# Patient Record
Sex: Male | Born: 1975 | Race: White | Hispanic: No | Marital: Married | Smoking: Never smoker
Health system: Southern US, Community
[De-identification: ages and names within clinical notes are randomized; demographics above are authoritative.]

## PROBLEM LIST (undated history)

## (undated) HISTORY — PX: GANGLION CYST EXCISION: SHX1691

---

## 2012-08-04 ENCOUNTER — Emergency Department (HOSPITAL_COMMUNITY)

## 2012-08-04 ENCOUNTER — Encounter (HOSPITAL_COMMUNITY): Payer: Self-pay | Admitting: *Deleted

## 2012-08-04 ENCOUNTER — Emergency Department (HOSPITAL_COMMUNITY)
Admission: EM | Admit: 2012-08-04 | Discharge: 2012-08-04 | Disposition: A | Discharge status: Home / Self Care | Attending: Emergency Medicine | Admitting: Emergency Medicine

## 2012-08-04 DIAGNOSIS — B9789 Other viral agents as the cause of diseases classified elsewhere: Secondary | ICD-10-CM | POA: Insufficient documentation

## 2012-08-04 DIAGNOSIS — R0789 Other chest pain: Secondary | ICD-10-CM | POA: Insufficient documentation

## 2012-08-04 DIAGNOSIS — R059 Cough, unspecified: Secondary | ICD-10-CM | POA: Insufficient documentation

## 2012-08-04 DIAGNOSIS — R05 Cough: Secondary | ICD-10-CM | POA: Insufficient documentation

## 2012-08-04 DIAGNOSIS — B349 Viral infection, unspecified: Secondary | ICD-10-CM

## 2012-08-04 MED ORDER — AZITHROMYCIN 250 MG PO TABS: 250.0000 mg | ORAL_TABLET | Freq: Every day | ORAL | Status: AC

## 2012-08-04 MED ORDER — IBUPROFEN 600 MG PO TABS: 600.0000 mg | ORAL_TABLET | Freq: Four times a day (QID) | ORAL | Status: AC | PRN

## 2012-08-04 NOTE — ED Notes (Signed)
States wife was dx with pneumonia yesterday.

## 2012-08-04 NOTE — ED Notes (Signed)
Pt c/o chest congestion, productive cough.  Cough worsens when lying down.  Feels SOB.  Denies n/v, fevers/chills.

## 2012-08-05 NOTE — ED Provider Notes (Signed)
History     CSN: [id number]  Arrival date & time 08/04/12  7829   First MD Initiated Contact with Patient 08/04/12 [phone]      Chief Complaint  Patient presents with  . Nasal Congestion  . Shortness of Breath    (Consider location/radiation/quality/duration/timing/severity/associated sxs/prior treatment) HPI Comments: Pt with no medical hx comes in with cc of cough and some chest tightness. Pt reports that his wife was dx with CAP, and this with productive cough with yellow green phlegm, and some chest tightness, he wanted to make sure he had no infection. Pt has no fevers, no n/v/chills/hemoptysis. Pt has bene having his sx for about 5 days now. No wheezing.  Patient is a 36 y.o. male presenting with shortness of breath. The history is provided by the patient.  Shortness of Breath  Associated symptoms include cough and shortness of breath. Pertinent negatives include no chest pain.    History reviewed. No pertinent past medical history.  Past Surgical History  Procedure Date  . Ganglion cyst excision     right wrist    History reviewed. No pertinent family history.  History  Substance Use Topics  . Smoking status: Never Smoker   . Smokeless tobacco: Not on file  . Alcohol Use: Yes     moeration      Review of Systems  Constitutional: Negative for activity change and appetite change.  Respiratory: Positive for cough, chest tightness and shortness of breath.   Cardiovascular: Negative for chest pain.  Gastrointestinal: Negative for abdominal pain.  Genitourinary: Negative for dysuria.    Allergies  Review of patient's allergies indicates no known allergies.  Home Medications   Current Outpatient Rx  Name Route Sig Dispense Refill  . NYQUIL PO Oral Take 2 capsules by mouth 2 (two) times daily as needed. For congestion    . AZITHROMYCIN 250 MG PO TABS Oral Take 1 tablet (250 mg total) by mouth daily. Take first 2 tablets together, then 1 every day until  finished. 6 tablet 0  . IBUPROFEN 600 MG PO TABS Oral Take 1 tablet (600 mg total) by mouth every 6 (six) hours as needed for pain. 30 tablet 0    BP 112/70  Pulse 56  Temp 97.2 F (36.2 C) (Oral)  Resp 18  SpO2 98%  Physical Exam  Nursing note and vitals reviewed. Constitutional: He is oriented to person, place, and time. He appears well-developed.  HENT:  Head: Normocephalic and atraumatic.  Eyes: Conjunctivae and EOM are normal. Pupils are equal, round, and reactive to light.  Neck: Normal range of motion. Neck supple.  Cardiovascular: Normal rate and regular rhythm.   Pulmonary/Chest: Effort normal and breath sounds normal. No respiratory distress. He has no wheezes. He has no rales. He exhibits no tenderness.  Abdominal: Soft. Bowel sounds are normal. He exhibits no distension. There is no tenderness. There is no rebound and no guarding.  Neurological: He is alert and oriented to person, place, and time.  Skin: Skin is warm.    ED Course  Procedures (including critical care time)  Labs Reviewed - No data to display Dg Chest 2 View  08/04/2012  *RADIOLOGY REPORT*  Clinical Data: Cough, shortness of breath, and chest pain.  CHEST - 2 VIEW  Comparison: None.  Findings: Pulmonary hyperinflation. The heart size and pulmonary vascularity are normal. The lungs appear clear and expanded without focal air space disease or consolidation. No blunting of the costophrenic angles. No pneumothorax.  Mediastinal contours  appear intact.  IMPRESSION: No evidence of active pulmonary disease.   Original Report Authenticated By: Marlon Pel, M.D.      1. Viral syndrome       MDM  DDX includes: Viral syndrome Influenza Pharyngitis CAP Bronchitis  Pt with 5 day of productive cough and chest tightness, with no wheezing, no fevers. Lung exam is completely normal. We will get CXR - if no infiltrate, will give AB since patient has a sick contact with CAP and CXr is not very sensitive  - but will advocate wait and watch approach.        Derwood Kaplan, MD 08/05/12 [phone]

## 2013-06-12 IMAGING — CR DG CHEST 2V
2 series · 2 of 2 positions shown · non-contrast
Comparison: None.

CLINICAL DATA: Cough, shortness of breath, and chest pain.

CHEST - 2 VIEW

[w chest pa]
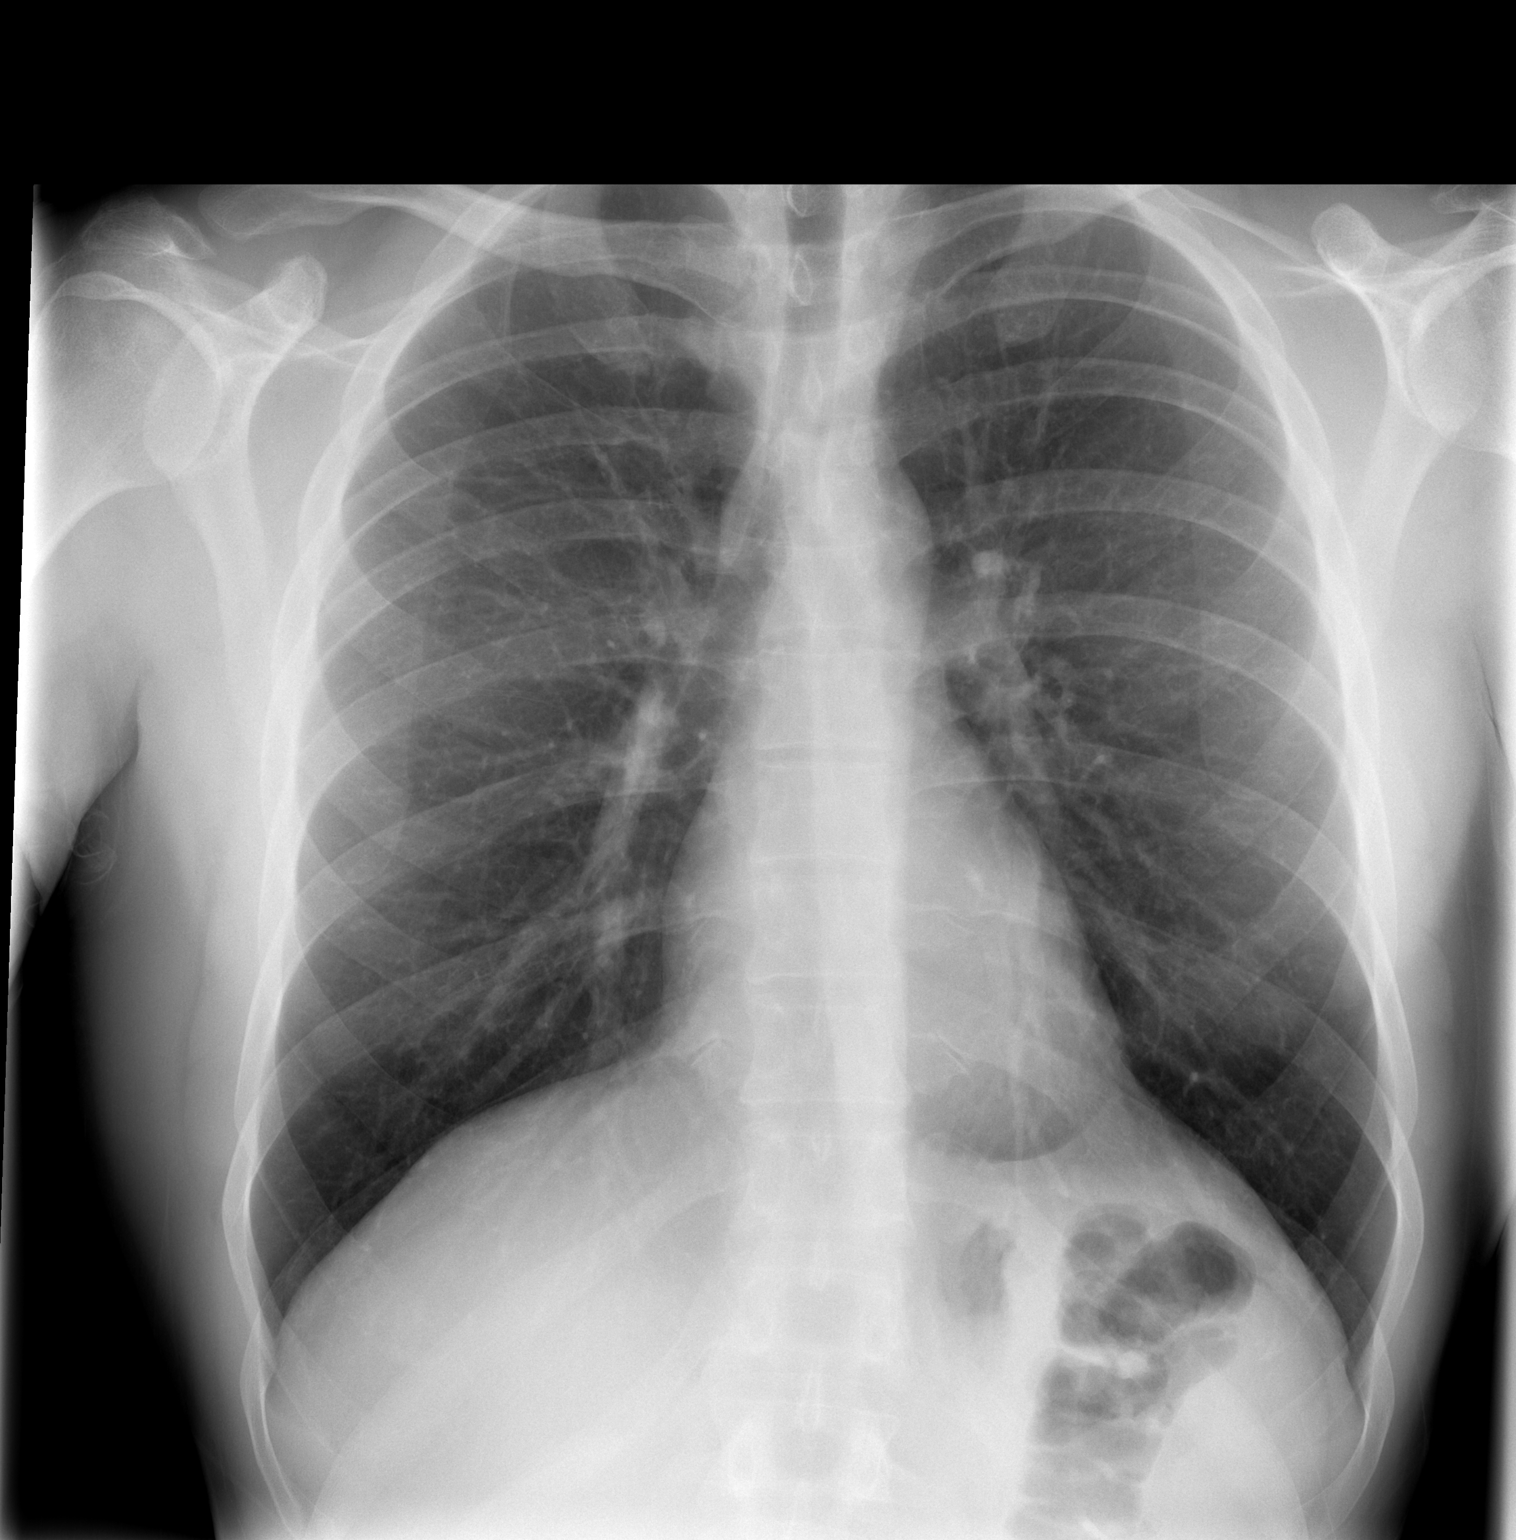

[w chest lat]
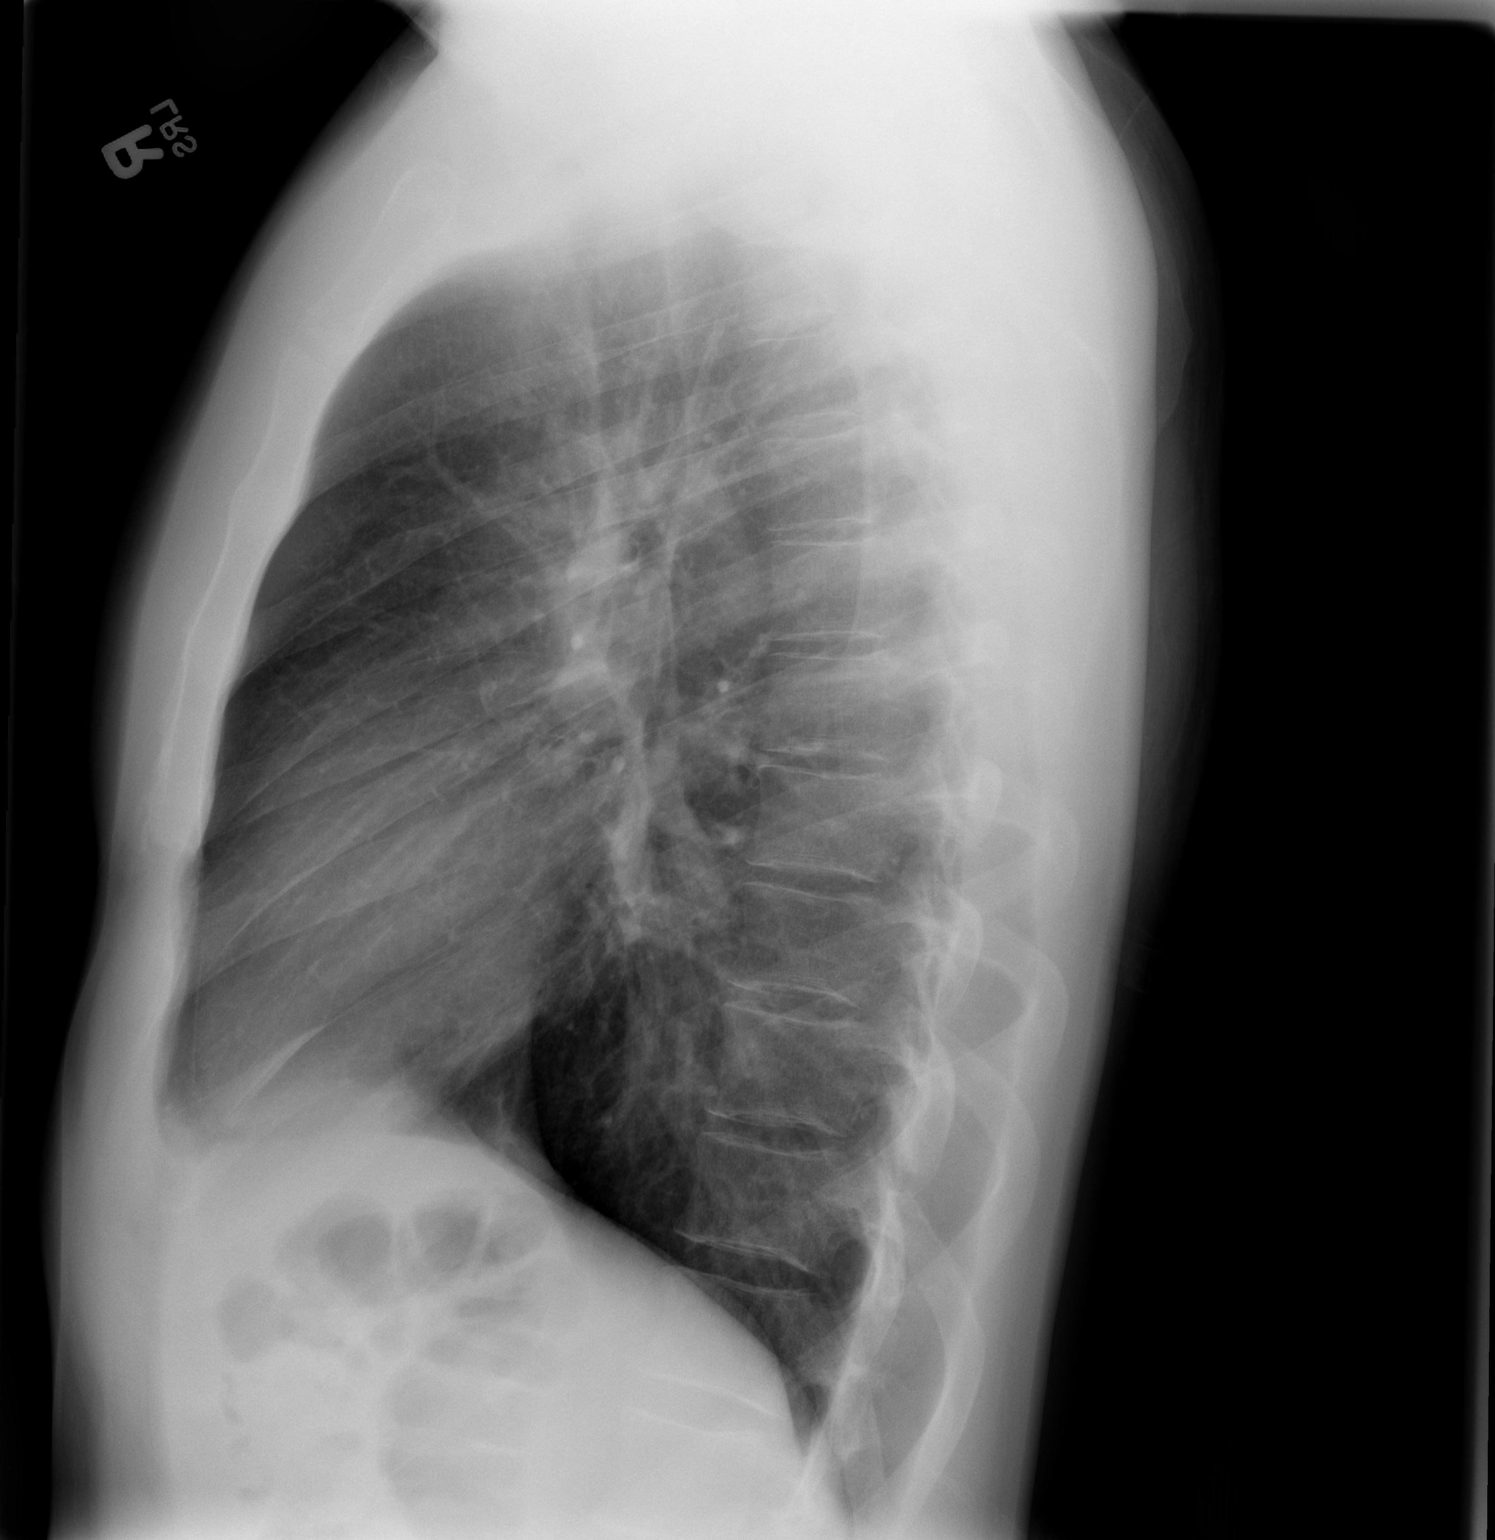

[2 of 2 positions shown; findings below may reference images not displayed]

FINDINGS: Pulmonary hyperinflation. The heart size and pulmonary
vascularity are normal. The lungs appear clear and expanded without
focal air space disease or consolidation. No blunting of the
costophrenic angles. No pneumothorax.  Mediastinal contours appear
intact.
IMPRESSION: No evidence of active pulmonary disease.

## 2015-11-19 ENCOUNTER — Ambulatory Visit: Payer: Self-pay | Admitting: Allergy and Immunology

## 2017-11-11 ENCOUNTER — Encounter (HOSPITAL_COMMUNITY): Payer: Self-pay | Admitting: Emergency Medicine

## 2017-11-11 ENCOUNTER — Emergency Department (HOSPITAL_BASED_OUTPATIENT_CLINIC_OR_DEPARTMENT_OTHER)
Admit: 2017-11-11 | Discharge: 2017-11-11 | Disposition: A | Discharge status: Home / Self Care | Attending: Emergency Medicine | Admitting: Emergency Medicine

## 2017-11-11 ENCOUNTER — Emergency Department (HOSPITAL_COMMUNITY)
Admission: EM | Admit: 2017-11-11 | Discharge: 2017-11-11 | Disposition: A | Discharge status: Home / Self Care | Attending: Emergency Medicine | Admitting: Emergency Medicine

## 2017-11-11 DIAGNOSIS — M79609 Pain in unspecified limb: Secondary | ICD-10-CM | POA: Diagnosis not present

## 2017-11-11 DIAGNOSIS — M5432 Sciatica, left side: Secondary | ICD-10-CM | POA: Diagnosis not present

## 2017-11-11 DIAGNOSIS — R2242 Localized swelling, mass and lump, left lower limb: Secondary | ICD-10-CM | POA: Diagnosis present

## 2017-11-11 DIAGNOSIS — M7989 Other specified soft tissue disorders: Secondary | ICD-10-CM

## 2017-11-11 MED ORDER — KETOROLAC TROMETHAMINE 60 MG/2ML IM SOLN
30.0000 mg | Freq: Once | INTRAMUSCULAR | Status: AC
  Administered 2017-11-11: 30 mg via INTRAMUSCULAR
  Filled 2017-11-11: qty 2

## 2017-11-11 NOTE — ED Provider Notes (Signed)
[facility] Provider Note   CSN: [id number] Arrival date & time: 11/11/17  1607     History   Chief Complaint Chief Complaint  Patient presents with  . Back Pain    HPI Brandon Hanna is a 41 y.o. male.  HPI   41 year old male presents today with complaints of right lower extremity swelling. Patient reports approximately week and half ago he noticed swelling mainly over the anterior aspect of his mid lower leg. Patient also notes pain in his calf with plantar flexion. Patient denies any distal swelling or edema, denies any trauma to the knee. Patient reports that he is very physically active in runs on trails. He does not recall any significant injury. Patient also notes he recently throughout his back. He notes pain in the lower back with radiation on the left leg. Patient notes a history of the same approximately 2 years ago. He denies any loss of sensation strength or motor function, reports pain worsened with flexion of the left hip. He denies any trauma.  History reviewed. No pertinent past medical history.  There are no active problems to display for this patient.   Past Surgical History:  Procedure Laterality Date  . GANGLION CYST EXCISION     right wrist       Home Medications    Prior to Admission medications   Medication Sig Start Date End Date Taking? Authorizing Provider  Pseudoeph-Doxylamine-DM-APAP (NYQUIL PO) Take 2 capsules by mouth 2 (two) times daily as needed. For congestion    [provider]    Family History History reviewed. No pertinent family history.  Social History Social History   Tobacco Use  . Smoking status: Never Smoker  . Smokeless tobacco: Never Used  Substance Use Topics  . Alcohol use: Yes    Comment: moeration  . Drug use: No     Allergies   Patient has no known allergies.   Review of Systems Review of Systems  All other systems reviewed and are  negative.    Physical Exam Updated Vital Signs BP 136/63 (BP Location: Right Arm)   Pulse 67   Temp 98.3 F (36.8 C) (Oral)   Resp 16   Wt 79.4 kg (175 lb)   SpO2 98%   Physical Exam  Constitutional: He is oriented to person, place, and time. He appears well-developed and well-nourished.  HENT:  Head: Normocephalic and atraumatic.  Eyes: Conjunctivae are normal. Pupils are equal, round, and reactive to light. Right eye exhibits no discharge. Left eye exhibits no discharge. No scleral icterus.  Neck: Normal range of motion. No JVD present. No tracheal deviation present.  Pulmonary/Chest: Effort normal. No stridor.  Musculoskeletal:  Right lower extremity with minor swelling over the anterior mid shin, no ankle or foot swelling, calf nontender, calf pain with plantar flexion- pedal pulses 2+, knee nontender with no swelling or edema for active range of motion.  No CT or L-spine tenderness, minor tenderness to the soft tissue of the left lateral lumbar region straight leg positive, distal sensation strength and motor function is intact   Neurological: He is alert and oriented to person, place, and time. Coordination normal.  Psychiatric: He has a normal mood and affect. His behavior is normal. Judgment and thought content normal.  Nursing note and vitals reviewed.    ED Treatments / Results  Labs (all labs ordered are listed, but only abnormal results are displayed) Labs Reviewed - No data to display  EKG  EKG Interpretation None       Radiology No results found.  Procedures Procedures (including critical care time)  Medications Ordered in ED Medications  ketorolac (TORADOL) injection 30 mg (30 mg Intramuscular Given 11/11/17 1928)     Initial Impression / Assessment and Plan / ED Course  I have reviewed the triage vital signs and the nursing notes.  Pertinent labs & imaging results that were available during my care of the patient were reviewed by me and  considered in my medical decision making (see chart for details).      Final Clinical Impressions(s) / ED Diagnoses   Final diagnoses:  Sciatica of left side  Leg swelling    Labs:   Imaging: Lower extremity venous  Consults:  Therapeutics:  Discharge Meds:   Assessment/Plan: 41 year old male presents today with complaints of low sure we swelling and back pain. Patient reports that he throughout his back, knees, reports before in the past. Patient denies any red flags, ambulance without significant difficulty. Given Toradol for this. Patient given symptomatic care instructions and outpatient follow-up. Patient's main complaint today was the swelling in his right leg. This is anterior swelling with no distal edema, and pain in his calf, has high suspicion for muscular strain. DVT study negative here. No signs of infectious etiology. Patient referred to orthopedics for this. Patient is given strict return cautioned, he verbalized understanding and agreement to today's plan had no further questions concerns at time of discharge.      ED Discharge Orders    None       Rosalio LoudHedges, Lotus Santillo, PA-C 11/11/17 2052    Benjiman CorePickering, Nathan, MD 11/11/17 [phone]

## 2017-11-11 NOTE — ED Triage Notes (Signed)
Pt to ER for evaluation of left lower back pain radiating down left leg. States hx of sciatica, states recently moved here and believes he "through his back out." Also states right calf pain onset a week ago with swelling, was sent here by PCP for rule out blood clot.

## 2017-11-11 NOTE — Discharge Instructions (Signed)
Please read attached information. If you experience any new or worsening signs or symptoms please return to the emergency room for evaluation. Please follow-up with your primary care provider or specialist as discussed.  °

## 2017-11-11 NOTE — Progress Notes (Signed)
RLE venous duplex prelim: negative for DVT.  Dulcey Riederer Eunice, RDMS, RVT  

## 2017-11-11 NOTE — ED Notes (Signed)
Vascular at bedside

## 2021-09-14 ENCOUNTER — Emergency Department (HOSPITAL_BASED_OUTPATIENT_CLINIC_OR_DEPARTMENT_OTHER)
Admission: EM | Admit: 2021-09-14 | Discharge: 2021-09-14 | Disposition: A | Discharge status: Home / Self Care | Attending: Emergency Medicine | Admitting: Emergency Medicine

## 2021-09-14 ENCOUNTER — Encounter (HOSPITAL_BASED_OUTPATIENT_CLINIC_OR_DEPARTMENT_OTHER): Payer: Self-pay | Admitting: Emergency Medicine

## 2021-09-14 ENCOUNTER — Other Ambulatory Visit: Payer: Self-pay

## 2021-09-14 ENCOUNTER — Emergency Department (HOSPITAL_BASED_OUTPATIENT_CLINIC_OR_DEPARTMENT_OTHER)

## 2021-09-14 DIAGNOSIS — U071 COVID-19: Secondary | ICD-10-CM | POA: Diagnosis not present

## 2021-09-14 DIAGNOSIS — Z28310 Unvaccinated for covid-19: Secondary | ICD-10-CM | POA: Insufficient documentation

## 2021-09-14 DIAGNOSIS — R059 Cough, unspecified: Secondary | ICD-10-CM | POA: Diagnosis present

## 2021-09-14 LAB — RESP PANEL BY RT-PCR (FLU A&B, COVID) ARPGX2
Influenza A by PCR: NEGATIVE
Influenza B by PCR: NEGATIVE
SARS Coronavirus 2 by RT PCR: POSITIVE — AB

## 2021-09-14 LAB — GROUP A STREP BY PCR: Group A Strep by PCR: NOT DETECTED

## 2021-09-14 MED ORDER — LIDOCAINE VISCOUS HCL 2 % MT SOLN: 5.0000 mL | Freq: Four times a day (QID) | OROMUCOSAL | 0 refills | Status: AC | PRN

## 2021-09-14 MED ORDER — DEXAMETHASONE SODIUM PHOSPHATE 10 MG/ML IJ SOLN
10.0000 mg | Freq: Once | INTRAMUSCULAR | Status: AC
  Administered 2021-09-14: 10 mg
  Filled 2021-09-14: qty 1

## 2021-09-14 NOTE — ED Provider Notes (Signed)
[facility] EMERGENCY DEPT Provider Note   CSN: [id number] Arrival date & time: 09/14/21  1235     History Chief Complaint  Patient presents with   Generalized Body Aches    Brandon Hanna is a 45 y.o. male.  45 year old male with no significant past medical history presents with complaint of cough, congestion, body aches, fever, sore throat.  Symptoms started on Tuesday (4 days ago).  Exposed to his mother who has not felt well recently.  Patient is taking Motrin and Tylenol with limited relief of his symptoms.  Patient is a non-smoker, otherwise healthy, no history of asthma or chronic lung disease.  Not vaccinated against COVID.  No other complaints or concerns.  Brandon Hanna was evaluated in Emergency Department on 09/14/2021 for the symptoms described in the history of present illness. He was evaluated in the context of the global COVID-19 pandemic, which necessitated consideration that the patient might be at risk for infection with the SARS-CoV-2 virus that causes COVID-19. Institutional protocols and algorithms that pertain to the evaluation of patients at risk for COVID-19 are in a state of rapid change based on information released by regulatory bodies including the CDC and federal and state organizations. These policies and algorithms were followed during the patient's care in the ED.       History reviewed. No pertinent past medical history.  There are no problems to display for this patient.   Past Surgical History:  Procedure Laterality Date   GANGLION CYST EXCISION     right wrist       No family history on file.  Social History   Tobacco Use   Smoking status: Never   Smokeless tobacco: Never  Vaping Use   Vaping Use: Never used  Substance Use Topics   Alcohol use: Yes    Comment: moeration   Drug use: No    Home Medications Prior to Admission medications   Medication Sig Start Date End Date Taking? Authorizing Provider  magic  mouthwash (lidocaine, diphenhydrAMINE, alum & mag hydroxide) suspension Swish and spit 5 mLs 4 (four) times daily as needed for mouth pain. 09/14/21  Yes Jeannie Fend, PA-C  Pseudoeph-Doxylamine-DM-APAP (NYQUIL PO) Take 2 capsules by mouth 2 (two) times daily as needed. For congestion    [provider]    Allergies    Patient has no known allergies.  Review of Systems   Review of Systems  Constitutional:  Positive for fever.  HENT:  Positive for congestion and sore throat. Negative for ear pain.   Respiratory:  Positive for cough.   Gastrointestinal:  Negative for diarrhea and vomiting.  Musculoskeletal:  Positive for arthralgias and myalgias.  Skin:  Negative for rash and wound.  Allergic/Immunologic: Negative for immunocompromised state.  Neurological:  Negative for weakness.  Hematological:  Negative for adenopathy.  Psychiatric/Behavioral:  Negative for confusion.   All other systems reviewed and are negative.  Physical Exam Updated Vital Signs BP 117/75 (BP Location: Right Arm)   Pulse 65   Temp 98.2 F (36.8 C) (Oral)   Resp 16   Ht 6\' 2"  (1.88 m)   Wt 77.1 kg   SpO2 99%   BMI 21.83 kg/m   Physical Exam Vitals and nursing note reviewed.  Constitutional:      General: He is not in acute distress.    Appearance: He is well-developed. He is not diaphoretic.  HENT:     Head: Normocephalic and atraumatic.     Right Ear: Tympanic  membrane and ear canal normal.     Left Ear: Tympanic membrane and ear canal normal.     Nose: Congestion present.     Mouth/Throat:     Mouth: Mucous membranes are moist.     Pharynx: Posterior oropharyngeal erythema present. No oropharyngeal exudate.  Eyes:     Conjunctiva/sclera: Conjunctivae normal.  Cardiovascular:     Rate and Rhythm: Normal rate and regular rhythm.     Heart sounds: Normal heart sounds.  Pulmonary:     Effort: Pulmonary effort is normal.     Breath sounds: Normal breath sounds.  Musculoskeletal:      Cervical back: Neck supple. Tenderness present.     Right lower leg: No edema.     Left lower leg: No edema.  Lymphadenopathy:     Cervical: No cervical adenopathy.  Skin:    General: Skin is warm and dry.  Neurological:     Mental Status: He is alert and oriented to person, place, and time.  Psychiatric:        Behavior: Behavior normal.    ED Results / Procedures / Treatments   Labs (all labs ordered are listed, but only abnormal results are displayed) Labs Reviewed  RESP PANEL BY RT-PCR (FLU A&B, COVID) ARPGX2 - Abnormal; Notable for the following components:      Result Value   SARS Coronavirus 2 by RT PCR POSITIVE (*)    All other components within normal limits  GROUP A STREP BY PCR    EKG None  Radiology DG Chest Port 1 View  Result Date: 09/14/2021 CLINICAL DATA:  Cough Sore throat Generalized aches Congestion EXAM: PORTABLE CHEST 1 VIEW COMPARISON:  08/04/2012 FINDINGS: The heart size and mediastinal contours are within normal limits. Both lungs are clear. The visualized skeletal structures are unremarkable. IMPRESSION: No active disease. Electronically Signed   By: Acquanetta Belling M.D.   On: 09/14/2021 14:38    Procedures Procedures   Medications Ordered in ED Medications  dexamethasone (DECADRON) injection 10 mg (has no administration in time range)    ED Course  I have reviewed the triage vital signs and the nursing notes.  Pertinent labs & imaging results that were available during my care of the patient were reviewed by me and considered in my medical decision making (see chart for details).  Clinical Course as of 09/14/21 1509  Sat Sep 14, 2021  [id number] 45 year old male with complaint of sore throat, cough, congestion and body aches onset 5 days ago.  On exam, is well-appearing.  Vitals reassuring with O2 sat 100% on room air.  Lungs clear to auscultation, found to have a beefy red irritated throat without exudate.  Chest x-ray is unremarkable, strep test  negative.  Patient is positive for COVID and negative for flu  Discussed Paxlovid, candidate based on not having been vaccinated.  Patient has declined at this time.  We will give Decadron for his throat pain prior to discharge.  Also given prescription for Magic mouthwash to help with his sore throat. [LM]    Clinical Course User Index [LM] Alden Hipp   MDM Rules/Calculators/A&P                           Final Clinical Impression(s) / ED Diagnoses Final diagnoses:  COVID    Rx / DC Orders ED Discharge Orders          Ordered    magic mouthwash (lidocaine,  diphenhydrAMINE, alum & mag hydroxide) suspension  4 times daily PRN        09/14/21 1506             Jeannie Fend, PA-C 09/14/21 1509    Tegeler, Canary Brim, MD 09/14/21 1515

## 2021-09-14 NOTE — ED Triage Notes (Signed)
Pt c/o sore throat (so sore cant even drink), generalized aches, congested.

## 2021-09-14 NOTE — Discharge Instructions (Addendum)
Recommend Motrin and Tylenol as directed. Can try Magic mouthwash, gargle and spit. Recheck for any worsening or concerning symptoms.

## 2022-07-23 IMAGING — DX DG CHEST 1V PORT
1 series · 2 of 2 positions shown · non-contrast
Comparison: 08/04/2012

CLINICAL DATA: Cough

Sore throat
Generalized aches
Congestion
EXAM:
PORTABLE CHEST 1 VIEW

[Series 1: chest · 0.14mm/px · 2 of 2 slices shown]
[im 1/2]
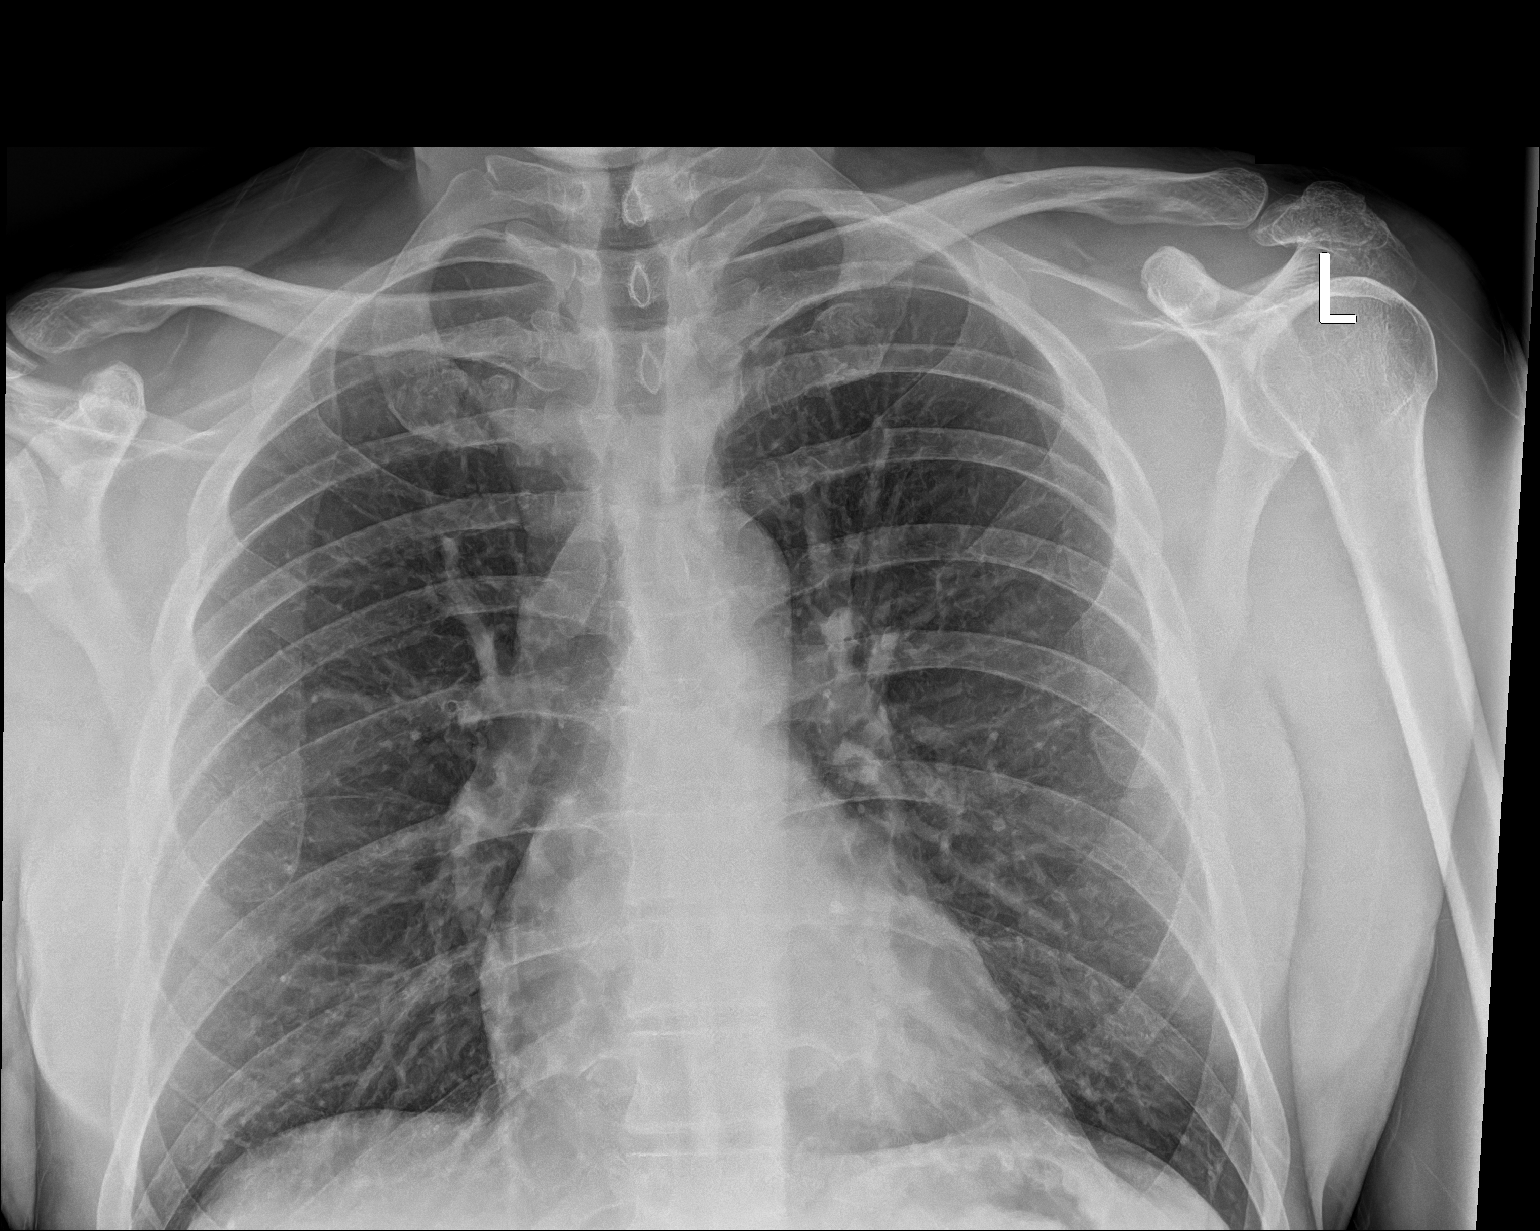
[im 2/2]
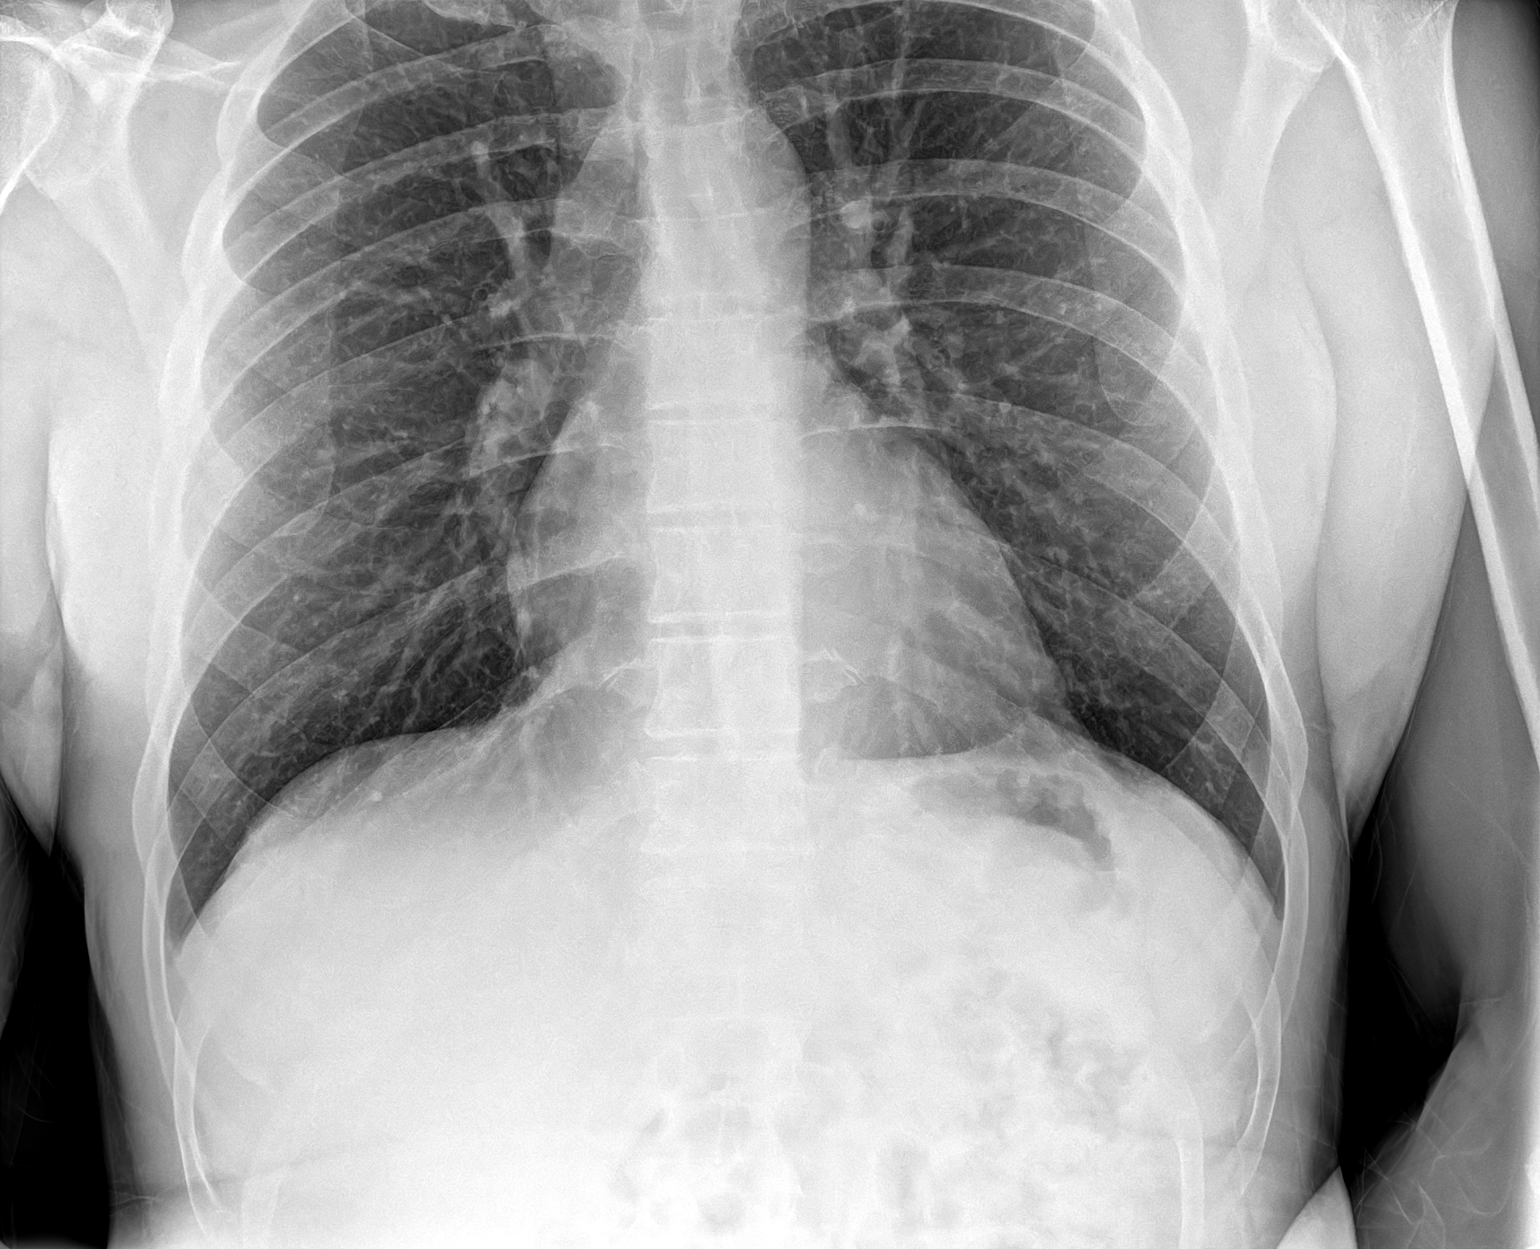

[2 of 2 positions shown; findings below may reference images not displayed]

FINDINGS: The heart size and mediastinal contours are within normal limits.
Both lungs are clear. The visualized skeletal structures are
unremarkable.
IMPRESSION: No active disease.
